# Patient Record
Sex: Female | Born: 1945 | Race: Black or African American | Hispanic: No | Marital: Single | Smoking: Current every day smoker
Health system: Southern US, Community
[De-identification: ages and names within clinical notes are randomized; demographics above are authoritative.]

## PROBLEM LIST (undated history)

## (undated) DIAGNOSIS — A6000 Herpesviral infection of urogenital system, unspecified: Secondary | ICD-10-CM

## (undated) DIAGNOSIS — J449 Chronic obstructive pulmonary disease, unspecified: Secondary | ICD-10-CM

## (undated) DIAGNOSIS — M1990 Unspecified osteoarthritis, unspecified site: Secondary | ICD-10-CM

## (undated) DIAGNOSIS — M069 Rheumatoid arthritis, unspecified: Secondary | ICD-10-CM

## (undated) DIAGNOSIS — J45909 Unspecified asthma, uncomplicated: Secondary | ICD-10-CM

## (undated) HISTORY — PX: TUBAL LIGATION: SHX77

## (undated) HISTORY — PX: TONSILLECTOMY: SUR1361

---

## 2013-02-13 ENCOUNTER — Emergency Department (HOSPITAL_COMMUNITY)
Admission: EM | Admit: 2013-02-13 | Discharge: 2013-02-13 | Disposition: A | Discharge status: Home / Self Care | Payer: Medicare Other | Attending: Emergency Medicine | Admitting: Emergency Medicine

## 2013-02-13 ENCOUNTER — Encounter (HOSPITAL_COMMUNITY): Payer: Self-pay | Admitting: Emergency Medicine

## 2013-02-13 DIAGNOSIS — M1990 Unspecified osteoarthritis, unspecified site: Secondary | ICD-10-CM | POA: Insufficient documentation

## 2013-02-13 DIAGNOSIS — J4489 Other specified chronic obstructive pulmonary disease: Secondary | ICD-10-CM | POA: Insufficient documentation

## 2013-02-13 DIAGNOSIS — M25569 Pain in unspecified knee: Secondary | ICD-10-CM | POA: Insufficient documentation

## 2013-02-13 DIAGNOSIS — J449 Chronic obstructive pulmonary disease, unspecified: Secondary | ICD-10-CM | POA: Insufficient documentation

## 2013-02-13 DIAGNOSIS — F17200 Nicotine dependence, unspecified, uncomplicated: Secondary | ICD-10-CM | POA: Insufficient documentation

## 2013-02-13 DIAGNOSIS — Z760 Encounter for issue of repeat prescription: Secondary | ICD-10-CM

## 2013-02-13 DIAGNOSIS — A6000 Herpesviral infection of urogenital system, unspecified: Secondary | ICD-10-CM | POA: Insufficient documentation

## 2013-02-13 DIAGNOSIS — M25561 Pain in right knee: Secondary | ICD-10-CM

## 2013-02-13 HISTORY — DX: Rheumatoid arthritis, unspecified: M06.9

## 2013-02-13 HISTORY — DX: Unspecified osteoarthritis, unspecified site: M19.90

## 2013-02-13 HISTORY — DX: Chronic obstructive pulmonary disease, unspecified: J44.9

## 2013-02-13 HISTORY — DX: Herpesviral infection of urogenital system, unspecified: A60.00

## 2013-02-13 MED ORDER — PREDNISONE 20 MG PO TABS: ORAL_TABLET | ORAL | Status: DC

## 2013-02-13 MED ORDER — VALACYCLOVIR HCL 1 G PO TABS: 1000.0000 mg | ORAL_TABLET | Freq: Two times a day (BID) | ORAL | Status: AC

## 2013-02-13 MED ORDER — PREDNISONE 20 MG PO TABS
60.0000 mg | ORAL_TABLET | Freq: Once | ORAL | Status: AC
  Administered 2013-02-13: 60 mg via ORAL
  Filled 2013-02-13: qty 3

## 2013-02-13 NOTE — ED Provider Notes (Signed)
History     CSN: [id number]  Arrival date & time 02/13/13  1478   First MD Initiated Contact with Patient 02/13/13 1015      Chief Complaint  Patient presents with  . Rheumatoid Arthritis  . Osteoarthritis  . genital herpes     (Consider location/radiation/quality/duration/timing/severity/associated sxs/prior treatment) HPI Comments: Patient who moved to area several days ago, presents with request for refill of Valtrex for flare of genital herpes as well as requesting prednisone for flare of rheumatoid arthritis. Patient has swelling and pain in her right knee which she states is consistent with reduced rheumatoid flares. Patient has pain medications at home that she uses as needed. She denies fever, chills, nausea, vomiting, abdominal pain, urinary symptoms. She also requests primary care physician referrals. Onset of symptoms gradual. Course is constant. Nothing makes symptoms better or worse.  The history is provided by the patient.    Past Medical History  Diagnosis Date  . Osteoarthritis   . Rheumatoid arthritis   . Genital herpes   . COPD (chronic obstructive pulmonary disease)     Past Surgical History  Procedure Laterality Date  . Tubal ligation    . Tonsillectomy      No family history on file.  History  Substance Use Topics  . Smoking status: Current Every Day Smoker -- 0.50 packs/day    Types: Cigarettes  . Smokeless tobacco: Not on file  . Alcohol Use: Yes    OB History   Grav Para Term Preterm Abortions TAB SAB Ect Mult Living                  Review of Systems  Constitutional: Negative for fever.  HENT: Negative for sore throat and rhinorrhea.   Eyes: Negative for redness.  Respiratory: Negative for cough.   Cardiovascular: Negative for chest pain.  Gastrointestinal: Negative for nausea, vomiting, abdominal pain and diarrhea.  Genitourinary: Positive for genital sores. Negative for dysuria.  Musculoskeletal: Positive for joint swelling and  arthralgias. Negative for myalgias.  Skin: Negative for rash.  Neurological: Negative for headaches.    Allergies  Penicillins  Home Medications   Current Outpatient Rx  Name  Route  Sig  Dispense  Refill  . valACYclovir (VALTREX) 1000 MG tablet   Oral   Take 1 tablet (1,000 mg total) by mouth 2 (two) times daily.   20 tablet   0     BP 146/98  Pulse 96  Temp(Src) 97.5 F (36.4 C) (Oral)  Resp 20  SpO2 99%  Physical Exam  Nursing note and vitals reviewed. Constitutional: She appears well-developed and well-nourished.  HENT:  Head: Normocephalic and atraumatic.  Eyes: Conjunctivae are normal.  Neck: Normal range of motion. Neck supple.  Pulmonary/Chest: No respiratory distress.  Genitourinary:  Patient defers GU exam.   Musculoskeletal:       Right hip: Normal.       Right knee: She exhibits swelling (mild). She exhibits normal range of motion and no effusion. Tenderness found. Lateral joint line tenderness noted. No medial joint line tenderness noted.       Right ankle: Normal.  Neurological: She is alert.  Skin: Skin is warm and dry.  Psychiatric: She has a normal mood and affect.    ED Course  Procedures (including critical care time)  Labs Reviewed - No data to display No results found.   1. Knee pain, right   2. Medication refill     10:25 AM Patient seen and examined.  Work-up initiated. Medications ordered.   Vital signs reviewed and are as follows: Filed Vitals:   02/13/13 1006  BP: 146/98  Pulse: 96  Temp: 97.5 F (36.4 C)  Resp: 20   D/c with course of valtrex and prednisone taper.   I asked nurse to have social worker see patient to provide additional PCP referrals.   Patient urged to return with worsening symptoms or other concerns. Patient verbalized understanding and agrees with plan.   Patient verbalizes understanding and agrees with plan.     MDM  Pt here for med refills. Genital herpes flare for first time in 3 years --  given valtrex. Pt does not report h/o kidney problems.   Also, prednisone given for joint pain. No suspicion of septic arthritis.         Renne Crigler, PA-C 02/13/13 1051

## 2013-02-13 NOTE — ED Provider Notes (Signed)
Medical screening examination/treatment/procedure(s) were performed by non-physician practitioner and as supervising physician I was immediately available for consultation/collaboration.  Ethelda Chick, MD 02/13/13 1051

## 2013-02-13 NOTE — ED Notes (Signed)
Patient states she just moved here 5 x days ago.  Patient claims she needs medication refills for genital herpes, rheumatoid arthritis, and osteoarthritis.

## 2013-03-03 ENCOUNTER — Encounter (HOSPITAL_COMMUNITY): Payer: Self-pay | Admitting: Adult Health

## 2013-03-03 ENCOUNTER — Emergency Department (HOSPITAL_COMMUNITY)
Admission: EM | Admit: 2013-03-03 | Discharge: 2013-03-03 | Disposition: A | Discharge status: Home / Self Care | Payer: Medicare Other | Attending: Emergency Medicine | Admitting: Emergency Medicine

## 2013-03-03 ENCOUNTER — Ambulatory Visit: Payer: Self-pay

## 2013-03-03 DIAGNOSIS — IMO0001 Reserved for inherently not codable concepts without codable children: Secondary | ICD-10-CM | POA: Insufficient documentation

## 2013-03-03 DIAGNOSIS — S90569A Insect bite (nonvenomous), unspecified ankle, initial encounter: Secondary | ICD-10-CM | POA: Insufficient documentation

## 2013-03-03 DIAGNOSIS — J449 Chronic obstructive pulmonary disease, unspecified: Secondary | ICD-10-CM | POA: Insufficient documentation

## 2013-03-03 DIAGNOSIS — Y929 Unspecified place or not applicable: Secondary | ICD-10-CM | POA: Insufficient documentation

## 2013-03-03 DIAGNOSIS — S40269A Insect bite (nonvenomous) of unspecified shoulder, initial encounter: Secondary | ICD-10-CM | POA: Insufficient documentation

## 2013-03-03 DIAGNOSIS — R21 Rash and other nonspecific skin eruption: Secondary | ICD-10-CM | POA: Insufficient documentation

## 2013-03-03 DIAGNOSIS — J4489 Other specified chronic obstructive pulmonary disease: Secondary | ICD-10-CM | POA: Insufficient documentation

## 2013-03-03 DIAGNOSIS — F17200 Nicotine dependence, unspecified, uncomplicated: Secondary | ICD-10-CM | POA: Insufficient documentation

## 2013-03-03 DIAGNOSIS — Z880 Allergy status to penicillin: Secondary | ICD-10-CM | POA: Insufficient documentation

## 2013-03-03 DIAGNOSIS — W57XXXA Bitten or stung by nonvenomous insect and other nonvenomous arthropods, initial encounter: Secondary | ICD-10-CM | POA: Insufficient documentation

## 2013-03-03 DIAGNOSIS — IMO0002 Reserved for concepts with insufficient information to code with codable children: Secondary | ICD-10-CM | POA: Insufficient documentation

## 2013-03-03 DIAGNOSIS — Z8739 Personal history of other diseases of the musculoskeletal system and connective tissue: Secondary | ICD-10-CM | POA: Insufficient documentation

## 2013-03-03 DIAGNOSIS — Z79899 Other long term (current) drug therapy: Secondary | ICD-10-CM | POA: Insufficient documentation

## 2013-03-03 DIAGNOSIS — Y9389 Activity, other specified: Secondary | ICD-10-CM | POA: Insufficient documentation

## 2013-03-03 MED ORDER — HYDROXYZINE HCL 25 MG PO TABS: 25.0000 mg | ORAL_TABLET | Freq: Four times a day (QID) | ORAL | Status: DC | PRN

## 2013-03-03 MED ORDER — PREDNISONE 20 MG PO TABS: 60.0000 mg | ORAL_TABLET | Freq: Once | ORAL | Status: AC

## 2013-03-03 MED ORDER — PREDNISONE 20 MG PO TABS
60.0000 mg | ORAL_TABLET | Freq: Once | ORAL | Status: AC
  Administered 2013-03-03: 60 mg via ORAL
  Filled 2013-03-03: qty 3

## 2013-03-03 MED ORDER — HYDROXYZINE HCL 25 MG PO TABS
25.0000 mg | ORAL_TABLET | Freq: Once | ORAL | Status: AC
  Administered 2013-03-03: 25 mg via ORAL
  Filled 2013-03-03: qty 1

## 2013-03-03 MED ORDER — HYDROXYZINE HCL 50 MG/ML IM SOLN: 25.0000 mg | Freq: Four times a day (QID) | INTRAMUSCULAR | Status: DC | PRN

## 2013-03-03 MED ORDER — FAMOTIDINE 20 MG PO TABS
20.0000 mg | ORAL_TABLET | Freq: Once | ORAL | Status: AC
  Administered 2013-03-03: 20 mg via ORAL
  Filled 2013-03-03: qty 1

## 2013-03-03 NOTE — ED Notes (Signed)
Patient C/o itching. States that she is allergic to insect bites and she was bitten. States that it began yesterday and she began taking benadryl.  States that it did not help.  Hives are noted on her arms, legs and trunk.

## 2013-03-03 NOTE — ED Provider Notes (Signed)
History    This chart was scribed for non-physician practitioner, Arnoldo Hooker PA-C working with Glynn Octave, MD by Donne Anon, ED Scribe. This patient was seen in room TR07C/TR07C and the patient's care was started at 2106.   CSN: [id number]  Arrival date & time 03/03/13  1850   First MD Initiated Contact with Patient 03/03/13 2106      Chief Complaint  Patient presents with  . Rash     The history is provided by the patient. No language interpreter was used.   HPI Comments: Julie Shepherd is a 67 y.o. female who presents to the Emergency Department complaining of gradual onset, gradually worsening, constant rash to her arms, chest, thighs, and feet and began this morning. She does not recall being bitten by any insects. She has tried Benadryl and Motrin with no relief. She reports no one she lives with has a similar rash. She reports she has an episode similar to this several years ago. She denies difficulty breathing, lip swelling, tongue swelling or any other pain.  Past Medical History  Diagnosis Date  . Osteoarthritis   . Rheumatoid arthritis(714.0)   . Genital herpes   . COPD (chronic obstructive pulmonary disease)     Past Surgical History  Procedure Laterality Date  . Tubal ligation    . Tonsillectomy      History reviewed. No pertinent family history.  History  Substance Use Topics  . Smoking status: Current Every Day Smoker -- 0.50 packs/day    Types: Cigarettes  . Smokeless tobacco: Not on file  . Alcohol Use: Yes     Review of Systems  Constitutional: Negative for fever.  HENT: Negative for facial swelling.   Respiratory: Negative for shortness of breath.   Gastrointestinal: Negative for nausea.  Skin: Positive for rash.    Allergies  Penicillins  Home Medications   Current Outpatient Rx  Name  Route  Sig  Dispense  Refill  . albuterol (PROVENTIL HFA;VENTOLIN HFA) 108 (90 BASE) MCG/ACT inhaler   Inhalation   Inhale 1 puff into the  lungs every 6 (six) hours as needed for wheezing or shortness of breath (COPD).          Marland Kitchen alendronate (FOSAMAX) 70 MG tablet   Oral   Take 70 mg by mouth every 7 (seven) days. Take with a full glass of water on an empty stomach.  Usually takes on Saturday.         . cyclobenzaprine (FLEXERIL) 10 MG tablet   Oral   Take 10 mg by mouth daily as needed for muscle spasms.         . Fluticasone-Salmeterol (ADVAIR) 500-50 MCG/DOSE AEPB   Inhalation   Inhale 1 puff into the lungs daily as needed (COPD).         Marland Kitchen HYDROcodone-acetaminophen (NORCO) 7.5-325 MG per tablet   Oral   Take 1 tablet by mouth 3 (three) times daily as needed for pain.         Marland Kitchen ibuprofen (ADVIL,MOTRIN) 800 MG tablet   Oral   Take 800 mg by mouth daily as needed for pain (Pain and swelling in knee).         Marland Kitchen leucovorin (WELLCOVORIN) 5 MG tablet   Oral   Take 10 mg by mouth once a week. Take two tablets once a week after taking methotrexate.         Marland Kitchen lisinopril (PRINIVIL,ZESTRIL) 10 MG tablet   Oral   Take 10 mg  by mouth daily.         . methotrexate (RHEUMATREX) 2.5 MG tablet   Oral   Take 20 mg by mouth once a week. Caution:Chemotherapy. Protect from light. Takes 8 tablets every week on Thursdays.         Marland Kitchen omeprazole (PRILOSEC) 20 MG capsule   Oral   Take 20 mg by mouth daily as needed (Acid reflux).         . Oxycodone-Acetaminophen (PERCOCET PO)   Oral   Take 1 tablet by mouth 2 (two) times daily as needed (Pain).         . predniSONE (DELTASONE) 20 MG tablet      3 Tabs PO Days 1-3, then 2 tabs PO Days 4-6, then 1 tab PO Day 7-9, then Half Tab PO Day 10-12   20 tablet   0   . traMADol (ULTRAM) 50 MG tablet   Oral   Take 50-100 mg by mouth daily as needed for pain (Knee arthritic pain).          . Vitamin D, Ergocalciferol, (DRISDOL) 50000 UNITS CAPS   Oral   Take 50,000 Units by mouth once a week.         . zolpidem (AMBIEN) 10 MG tablet   Oral   Take 5-10 mg  by mouth at bedtime as needed for sleep.            BP 137/91  Pulse 95  Temp(Src) 98.3 F (36.8 C) (Oral)  Resp 16  Wt 139 lb 8 oz (63.277 kg)  SpO2 100%  Physical Exam  Nursing note and vitals reviewed. Constitutional: She is oriented to person, place, and time. She appears well-developed and well-nourished. No distress.  HENT:  Head: Normocephalic and atraumatic.  Eyes: EOM are normal.  Neck: Neck supple. No tracheal deviation present.  Cardiovascular: Normal rate.   Pulmonary/Chest: Effort normal. No respiratory distress.  Musculoskeletal: Normal range of motion.  Neurological: She is alert and oriented to person, place, and time.  Skin: Skin is warm and dry.  Singular raised lesions with central induration to left lower extremity and right upper extremity and shoulder consistent with insect bites. Inconsistent with hives.  Psychiatric: She has a normal mood and affect. Her behavior is normal.    ED Course  Procedures (including critical care time) DIAGNOSTIC STUDIES: Oxygen Saturation is 100% on room air, normal by my interpretation.    COORDINATION OF CARE: 9:12 PM Discussed treatment plan which includes Prednisone and an antiinflamitory with pt at bedside and pt agreed to plan. Advised pt to change locag    Labs Reviewed - No data to display No results found.   No diagnosis found.  1. Bug bites  MDM  Findings not consistent with hives, rather multiple bites of an insect - possible bedbugs as she is staying in a new place temporarily while in [place].    I personally performed the services described in this documentation, which was scribed in my presence. The recorded information has been reviewed and is accurate.       Arnoldo Hooker, PA-C 03/03/13 2138

## 2013-03-03 NOTE — ED Notes (Signed)
Presents with rash to bilateral extemities associated with itchiness. Pt reports it began last night and has tried benadryl with no relief.

## 2013-03-04 NOTE — ED Provider Notes (Signed)
Medical screening examination/treatment/procedure(s) were performed by non-physician practitioner and as supervising physician I was immediately available for consultation/collaboration.   Sie Formisano, MD 03/04/13 0015 

## 2013-08-07 ENCOUNTER — Emergency Department (HOSPITAL_COMMUNITY)
Admission: EM | Admit: 2013-08-07 | Discharge: 2013-08-07 | Disposition: A | Discharge status: Home / Self Care | Payer: Medicare Other | Attending: Emergency Medicine | Admitting: Emergency Medicine

## 2013-08-07 ENCOUNTER — Encounter (HOSPITAL_COMMUNITY): Payer: Self-pay | Admitting: Emergency Medicine

## 2013-08-07 DIAGNOSIS — M25569 Pain in unspecified knee: Secondary | ICD-10-CM | POA: Insufficient documentation

## 2013-08-07 DIAGNOSIS — M069 Rheumatoid arthritis, unspecified: Secondary | ICD-10-CM | POA: Insufficient documentation

## 2013-08-07 DIAGNOSIS — J4489 Other specified chronic obstructive pulmonary disease: Secondary | ICD-10-CM | POA: Insufficient documentation

## 2013-08-07 DIAGNOSIS — Z880 Allergy status to penicillin: Secondary | ICD-10-CM | POA: Insufficient documentation

## 2013-08-07 DIAGNOSIS — Z8619 Personal history of other infectious and parasitic diseases: Secondary | ICD-10-CM | POA: Insufficient documentation

## 2013-08-07 DIAGNOSIS — Z760 Encounter for issue of repeat prescription: Secondary | ICD-10-CM | POA: Insufficient documentation

## 2013-08-07 DIAGNOSIS — J449 Chronic obstructive pulmonary disease, unspecified: Secondary | ICD-10-CM | POA: Insufficient documentation

## 2013-08-07 DIAGNOSIS — G8929 Other chronic pain: Secondary | ICD-10-CM | POA: Insufficient documentation

## 2013-08-07 DIAGNOSIS — M1990 Unspecified osteoarthritis, unspecified site: Secondary | ICD-10-CM | POA: Insufficient documentation

## 2013-08-07 DIAGNOSIS — F17200 Nicotine dependence, unspecified, uncomplicated: Secondary | ICD-10-CM | POA: Insufficient documentation

## 2013-08-07 DIAGNOSIS — Z79899 Other long term (current) drug therapy: Secondary | ICD-10-CM | POA: Insufficient documentation

## 2013-08-07 MED ORDER — PREDNISONE 20 MG PO TABS: ORAL_TABLET | ORAL | Status: DC

## 2013-08-07 MED ORDER — HYDROCODONE-ACETAMINOPHEN 5-325 MG PO TABS
1.0000 | ORAL_TABLET | Freq: Once | ORAL | Status: AC
  Administered 2013-08-07: 1 via ORAL
  Filled 2013-08-07: qty 1

## 2013-08-07 NOTE — ED Provider Notes (Signed)
Medical screening examination/treatment/procedure(s) were performed by non-physician practitioner and as supervising physician I was immediately available for consultation/collaboration.  EKG Interpretation   None        Jantz Main, MD 08/07/13 1634 

## 2013-08-07 NOTE — ED Provider Notes (Signed)
CSN: [id number]     Arrival date & time 08/07/13  1016 History   First MD Initiated Contact with Patient 08/07/13 1019     Chief Complaint  Patient presents with  . Medication Refill   (Consider location/radiation/quality/duration/timing/severity/associated sxs/prior Treatment) HPI  67 year old female with history of rheumatoid arthritis presents to the ER requesting for medication refill. Patient states she has been dealing with rheumatoid arthritis for the past 12 years. She is here today requesting for refill of her hydrocodone that she has recently ran out several days ago. States she has ibuprofen and tramadol at home for pain. Her rheumatoid arthritis seems to affect her right knee the most pain has been flaring up for the past several weeks. Her doctor is in [facility] She is planning to move to [facility] permanently and also wanted referral resources for PCP.  She planned on having her right knee operated with knee replacement after the holiday and also request for an orthopedic referral. Otherwise patient has no other complaints. Specifically no fever, rash, numbness or weakness. Denies any history of kidney disease. Request for refill of her prednisone as it usually helps her RA.    Past Medical History  Diagnosis Date  . Osteoarthritis   . Rheumatoid arthritis(714.0)   . Genital herpes   . COPD (chronic obstructive pulmonary disease)    Past Surgical History  Procedure Laterality Date  . Tubal ligation    . Tonsillectomy     History reviewed. No pertinent family history. History  Substance Use Topics  . Smoking status: Current Every Day Smoker -- 0.50 packs/day    Types: Cigarettes  . Smokeless tobacco: Not on file  . Alcohol Use: Yes   OB History   Grav Para Term Preterm Abortions TAB SAB Ect Mult Living                 Review of Systems  Constitutional: Negative for fever.  Skin: Negative for rash.  Neurological: Negative for numbness.  All other  systems reviewed and are negative.    Allergies  Penicillins  Home Medications   Current Outpatient Rx  Name  Route  Sig  Dispense  Refill  . albuterol (PROVENTIL HFA;VENTOLIN HFA) 108 (90 BASE) MCG/ACT inhaler   Inhalation   Inhale 1 puff into the lungs every 6 (six) hours as needed for wheezing or shortness of breath (COPD).          Marland Kitchen alendronate (FOSAMAX) 70 MG tablet   Oral   Take 70 mg by mouth every 7 (seven) days. Take with a full glass of water on an empty stomach.  Usually takes on Saturday.         . hydrOXYzine (ATARAX/VISTARIL) 25 MG tablet   Oral   Take 1 tablet (25 mg total) by mouth every 6 (six) hours as needed for itching.   15 tablet   0   . methotrexate (RHEUMATREX) 2.5 MG tablet   Oral   Take 20 mg by mouth once a week. Caution:Chemotherapy. Protect from light. Takes 8 tablets every week on Thursdays.         Marland Kitchen omeprazole (PRILOSEC) 20 MG capsule   Oral   Take 20 mg by mouth daily as needed (Acid reflux).          There were no vitals taken for this visit. Physical Exam  Nursing note and vitals reviewed. Constitutional: She appears well-developed and well-nourished. No distress.  HENT:  Head: Atraumatic.  Eyes: Conjunctivae are  normal.  Neck: Neck supple.  Musculoskeletal: She exhibits tenderness (R knee: mildly tender with flexion, normal extension, no rash, no erythema, warmth or signs of infection.  ).  Neurological: She is alert.  Able to ambulate  Skin: No rash noted.  Psychiatric: She has a normal mood and affect.    ED Course  Procedures (including critical care time)  10:33 AM Pt request for refill of her narcotic pain med, which i declined.  I recommend having her PCP manage her chronic pain.  Will refill prednisone course, as well as giving pt outpt resources and ortho referral as requested.  Otherwise, no evidence of septic arthritis or other acute finding on today's exam.  Pt agrees with plan.    Labs Review Labs  Reviewed - No data to display Imaging Review No results found.  EKG Interpretation   None       MDM   1. Encounter for medication refill    BP 138/85  Pulse 95  Temp(Src) 97.9 F (36.6 C) (Oral)  Resp 16  SpO2 100%     Fayrene Helper, PA-C 08/07/13 1037

## 2013-08-07 NOTE — ED Notes (Signed)
Pt states she wants refill of pain medications for RA, denies any new problems

## 2013-09-09 ENCOUNTER — Other Ambulatory Visit: Payer: Self-pay | Admitting: Orthopaedic Surgery

## 2013-09-09 DIAGNOSIS — M25561 Pain in right knee: Secondary | ICD-10-CM

## 2013-09-17 ENCOUNTER — Other Ambulatory Visit: Payer: Medicare Other

## 2013-09-25 ENCOUNTER — Other Ambulatory Visit: Payer: Medicare Other

## 2013-10-04 ENCOUNTER — Other Ambulatory Visit: Payer: Medicare Other

## 2013-10-17 ENCOUNTER — Emergency Department (HOSPITAL_COMMUNITY)
Admission: EM | Admit: 2013-10-17 | Discharge: 2013-10-17 | Discharge status: Against Medical Advice | Payer: Medicare Other | Attending: Emergency Medicine | Admitting: Emergency Medicine

## 2013-10-17 ENCOUNTER — Emergency Department (HOSPITAL_COMMUNITY): Payer: Medicare Other

## 2013-10-17 ENCOUNTER — Encounter (HOSPITAL_COMMUNITY): Payer: Self-pay | Admitting: Emergency Medicine

## 2013-10-17 DIAGNOSIS — F17200 Nicotine dependence, unspecified, uncomplicated: Secondary | ICD-10-CM | POA: Insufficient documentation

## 2013-10-17 DIAGNOSIS — J45901 Unspecified asthma with (acute) exacerbation: Principal | ICD-10-CM

## 2013-10-17 DIAGNOSIS — J441 Chronic obstructive pulmonary disease with (acute) exacerbation: Secondary | ICD-10-CM | POA: Insufficient documentation

## 2013-10-17 HISTORY — DX: Unspecified asthma, uncomplicated: J45.909

## 2013-10-17 MED ORDER — ALBUTEROL SULFATE (2.5 MG/3ML) 0.083% IN NEBU
5.0000 mg | INHALATION_SOLUTION | Freq: Once | RESPIRATORY_TRACT | Status: AC
  Administered 2013-10-17: 5 mg via RESPIRATORY_TRACT
  Filled 2013-10-17: qty 6

## 2013-10-17 NOTE — ED Notes (Signed)
Pt. reports asthma attack onset today unrelieved by MDI , occasional productive cough , wheezing and nasal congestion . Denies fever or chills.

## 2013-10-17 NOTE — ED Notes (Signed)
Pt left- stating she had to go pick up granddaughter to [facility] because she was going into labor.

## 2013-12-28 ENCOUNTER — Encounter (HOSPITAL_COMMUNITY): Payer: Self-pay | Admitting: Emergency Medicine

## 2013-12-28 ENCOUNTER — Emergency Department (HOSPITAL_COMMUNITY): Payer: Medicare Other

## 2013-12-28 ENCOUNTER — Emergency Department (HOSPITAL_COMMUNITY)
Admission: EM | Admit: 2013-12-28 | Discharge: 2013-12-28 | Disposition: A | Discharge status: Home / Self Care | Payer: Medicare Other | Attending: Emergency Medicine | Admitting: Emergency Medicine

## 2013-12-28 DIAGNOSIS — M069 Rheumatoid arthritis, unspecified: Secondary | ICD-10-CM | POA: Insufficient documentation

## 2013-12-28 DIAGNOSIS — M25569 Pain in unspecified knee: Secondary | ICD-10-CM | POA: Insufficient documentation

## 2013-12-28 DIAGNOSIS — Z880 Allergy status to penicillin: Secondary | ICD-10-CM | POA: Insufficient documentation

## 2013-12-28 DIAGNOSIS — J449 Chronic obstructive pulmonary disease, unspecified: Secondary | ICD-10-CM | POA: Insufficient documentation

## 2013-12-28 DIAGNOSIS — Z79899 Other long term (current) drug therapy: Secondary | ICD-10-CM | POA: Insufficient documentation

## 2013-12-28 DIAGNOSIS — A6000 Herpesviral infection of urogenital system, unspecified: Secondary | ICD-10-CM | POA: Insufficient documentation

## 2013-12-28 DIAGNOSIS — J45909 Unspecified asthma, uncomplicated: Secondary | ICD-10-CM | POA: Insufficient documentation

## 2013-12-28 DIAGNOSIS — J4489 Other specified chronic obstructive pulmonary disease: Secondary | ICD-10-CM | POA: Insufficient documentation

## 2013-12-28 DIAGNOSIS — M25561 Pain in right knee: Secondary | ICD-10-CM

## 2013-12-28 DIAGNOSIS — F17200 Nicotine dependence, unspecified, uncomplicated: Secondary | ICD-10-CM | POA: Insufficient documentation

## 2013-12-28 DIAGNOSIS — M1990 Unspecified osteoarthritis, unspecified site: Secondary | ICD-10-CM | POA: Insufficient documentation

## 2013-12-28 DIAGNOSIS — M25469 Effusion, unspecified knee: Secondary | ICD-10-CM | POA: Insufficient documentation

## 2013-12-28 MED ORDER — KETOROLAC TROMETHAMINE 60 MG/2ML IM SOLN
30.0000 mg | Freq: Once | INTRAMUSCULAR | Status: AC
  Administered 2013-12-28: 30 mg via INTRAMUSCULAR
  Filled 2013-12-28: qty 2

## 2013-12-28 MED ORDER — OXYCODONE-ACETAMINOPHEN 5-325 MG PO TABS: 1.0000 | ORAL_TABLET | Freq: Three times a day (TID) | ORAL | Status: AC | PRN

## 2013-12-28 NOTE — ED Provider Notes (Signed)
CSN: [id number]     Arrival date & time 12/28/13  1614 History  This chart was scribed for non-physician practitioner Felicie Morn, NP working with Glynn Octave, MD by Valera Castle, ED scribe. This patient was seen in room TR06C/TR06C and the patient's care was started at 6:10 PM.   Chief Complaint  Patient presents with  . Knee Pain   (Consider location/radiation/quality/duration/timing/severity/associated sxs/prior Treatment) Patient is a 68 y.o. female presenting with knee pain. The history is provided by the patient. No language interpreter was used.  Knee Pain Location:  Knee Knee location:  R knee Pain details:    Quality:  Sharp   Progression:  Worsening Chronicity:  Recurrent (rheumatoid arthritis) Relieved by:  Nothing Ineffective treatments:  Muscle relaxant, NSAIDs and acetaminophen  HPI Comments: Julie Shepherd is a 68 y.o. female with h/o right knee pain and arthritis, who presents to the Emergency Department complaining of sharp, stabbing pain in her medial right knee. She states her knee gave out on her yesterday, and does so from time to time. She states she has a knee brace at home, but has not been wearing it due to pain. She reports trouble sleeping due to her knee pain despite taking Ambien. She reports taking Tramadol, Ibuprofen, Meloxicam, and Flexeril without relief. She states she has gone to pain management, but has not heard back from them. She states she has been told she needs to have knee surgery, was resistant in the past, but is open to the idea now due to the increased pain. She states she has a rheumatology appointment on 04/13. She denies any other symptoms. She reports h/o smoking.   PCP - No PCP Per Patient  Past Medical History  Diagnosis Date  . Osteoarthritis   . Rheumatoid arthritis(714.0)   . Genital herpes   . COPD (chronic obstructive pulmonary disease)   . Asthma    Past Surgical History  Procedure Laterality Date  . Tubal ligation     . Tonsillectomy     No family history on file. History  Substance Use Topics  . Smoking status: Current Every Day Smoker -- 0.50 packs/day    Types: Cigarettes  . Smokeless tobacco: Not on file  . Alcohol Use: Yes   OB History   Grav Para Term Preterm Abortions TAB SAB Ect Mult Living                 Review of Systems  Musculoskeletal: Positive for arthralgias (right knee) and joint swelling. Negative for myalgias.  All other systems reviewed and are negative.   Allergies  Penicillins  Home Medications   Current Outpatient Rx  Name  Route  Sig  Dispense  Refill  . alendronate (FOSAMAX) 70 MG tablet   Oral   Take 70 mg by mouth every 7 (seven) days. Take with a full glass of water on an empty stomach.  Usually takes on Saturday.         . cyclobenzaprine (FLEXERIL) 10 MG tablet   Oral   Take 10 mg by mouth 3 (three) times daily as needed for muscle spasms.         . Fluticasone-Salmeterol (ADVAIR) 500-50 MCG/DOSE AEPB   Inhalation   Inhale 1 puff into the lungs daily as needed (for shortness of breath).          Marland Kitchen ibuprofen (ADVIL,MOTRIN) 800 MG tablet   Oral   Take 800 mg by mouth every 8 (eight) hours as needed for pain.         Marland Kitchen  leucovorin (WELLCOVORIN) 10 MG tablet   Oral   Take 20 mg by mouth once a week. Takes 2 tabs on the day after methotrexate (tuesday)         . methotrexate (RHEUMATREX) 2.5 MG tablet   Oral   Take 20 mg by mouth once a week. Caution:Chemotherapy. Protect from light. Takes 8 tablets every week on Tuesdays.         . traMADol (ULTRAM) 50 MG tablet   Oral   Take 50 mg by mouth every 12 (twelve) hours as needed for moderate pain.         Marland Kitchen zolpidem (AMBIEN) 10 MG tablet   Oral   Take 10 mg by mouth at bedtime as needed for sleep.          BP 129/87  Pulse 115  Temp(Src) 98 F (36.7 C) (Oral)  Resp 18  SpO2 96%  Physical Exam  Nursing note and vitals reviewed. Constitutional: She is oriented to person,  place, and time. She appears well-developed and well-nourished. No distress.  HENT:  Head: Normocephalic and atraumatic.  Eyes: EOM are normal.  Neck: Neck supple. No tracheal deviation present.  Cardiovascular: Normal rate and intact distal pulses.   Pulmonary/Chest: Effort normal. No respiratory distress.  Musculoskeletal: Normal range of motion. She exhibits edema and tenderness.  Right knee, mild tenderness and swelling. Joint is stable. DP intact.   Neurological: She is alert and oriented to person, place, and time.  Skin: Skin is warm and dry.  Psychiatric: She has a normal mood and affect. Her behavior is normal.   ED Course  Procedures (including critical care time)  DIAGNOSTIC STUDIES: Oxygen Saturation is 96% on room air, normal by my interpretation.    COORDINATION OF CARE: 6:23 PM-Discussed treatment plan which includes stronger pain medicaion with pt at bedside and pt agreed to plan.   No results found for this or any previous visit. Dg Shoulder Right  12/28/2013   CLINICAL DATA:  Fall, right shoulder and knee pain  EXAM: RIGHT SHOULDER - 2+ VIEW  COMPARISON:  Prior chest x-ray 10/17/2013  FINDINGS: There is no evidence of fracture or dislocation. There is no evidence of arthropathy or other focal bone abnormality. Soft tissues are unremarkable.  IMPRESSION: Negative.   Electronically Signed   By: Malachy Moan M.D.   On: 12/28/2013 17:54   Dg Knee Complete 4 Views Right  12/28/2013   CLINICAL DATA:  Fall, right shoulder in the pain  EXAM: RIGHT KNEE - COMPLETE 4+ VIEW  COMPARISON:  None.  FINDINGS: No acute fracture or malalignment. Moderately large suprapatellar knee joint effusion. Mild tricompartmental degenerative osteoarthritis most significant in the medial compartment where there is asymmetric joint space narrowing and productive osteophyte formation. Mild scattered atherosclerotic vascular calcifications noted incidentally. No lytic or blastic osseous lesion.   IMPRESSION: 1. Moderately large suprapatellar knee joint effusion. Differential considerations include acute internal derangement with hemarthrosis, degenerative, and less likely infectious. 2. No acute fracture or malalignment. 3. Tricompartmental degenerative osteoarthritis most significant in the medial compartment.   Electronically Signed   By: Malachy Moan M.D.   On: 12/28/2013 17:56    EKG Interpretation None     Medications - No data to display  Radiology results reviewed and shared with patient. MDM   Final diagnoses:  None    Chronic right knee pain.  History of recurrent joint effusion.  No fever, is able to bend and extend knee, doubt septic joint. Follow-up with  her orthopedist.  I personally performed the services described in this documentation, which was scribed in my presence. The recorded information has been reviewed and is accurate.    Jimmye Norman, NP 12/29/13 [phone]

## 2013-12-28 NOTE — ED Notes (Signed)
30 min wait for D/C because of IM Pain medication.

## 2013-12-28 NOTE — ED Notes (Addendum)
Pt c/o right knee and right shoulder pain. sts hx of arthritis. sts she fell today while she was trying to get out of the pain, her knee gave out causing her to fall. sts her knee gives out often, especially when she is in a rush and was rushing this morning. Reports she has been trying to get into a doctor and pain management center but hasn't been able to get an appointment yet. Pt sts she would like an xray and pain medicine. Hx of fracture to right knee. Denies bruising and swelling to knee and shoulder sts she does use a cane sometimes and is supposed to wear a knee brace but doesn't wear it because it isn't comfortable. Nad, skin warm and dry, resp e/u.

## 2013-12-28 NOTE — ED Notes (Signed)
Patient transported to X-ray 

## 2013-12-28 NOTE — Discharge Instructions (Signed)

## 2013-12-29 NOTE — ED Provider Notes (Signed)
Medical screening examination/treatment/procedure(s) were performed by non-physician practitioner and as supervising physician I was immediately available for consultation/collaboration.   EKG Interpretation None        Glynn Octave, MD 12/29/13 1016

## 2014-01-08 ENCOUNTER — Encounter (HOSPITAL_COMMUNITY): Payer: Self-pay | Admitting: Emergency Medicine

## 2014-01-08 ENCOUNTER — Emergency Department (INDEPENDENT_AMBULATORY_CARE_PROVIDER_SITE_OTHER)
Admission: EM | Admit: 2014-01-08 | Discharge: 2014-01-08 | Disposition: A | Discharge status: Home / Self Care | Payer: Medicare Other | Source: Home / Self Care

## 2014-01-08 DIAGNOSIS — M25561 Pain in right knee: Secondary | ICD-10-CM

## 2014-01-08 DIAGNOSIS — M25569 Pain in unspecified knee: Secondary | ICD-10-CM

## 2014-01-08 DIAGNOSIS — M25469 Effusion, unspecified knee: Secondary | ICD-10-CM | POA: Diagnosis not present

## 2014-01-08 DIAGNOSIS — M1710 Unilateral primary osteoarthritis, unspecified knee: Secondary | ICD-10-CM | POA: Diagnosis not present

## 2014-01-08 DIAGNOSIS — IMO0002 Reserved for concepts with insufficient information to code with codable children: Secondary | ICD-10-CM | POA: Diagnosis not present

## 2014-01-08 DIAGNOSIS — M179 Osteoarthritis of knee, unspecified: Secondary | ICD-10-CM

## 2014-01-08 DIAGNOSIS — M25461 Effusion, right knee: Secondary | ICD-10-CM

## 2014-01-08 NOTE — ED Provider Notes (Signed)
CSN: [id number]     Arrival date & time 01/08/14  0912 History   First MD Initiated Contact with Patient 01/08/14 [phone]     Chief Complaint  Patient presents with  . Knee Pain   (Consider location/radiation/quality/duration/timing/severity/associated sxs/prior Treatment) HPI Comments: 68 year old female presenting with a complaint of acute on chronic right knee pain. She has a history of osteoarthritis, DJD of the right knee. She was seen in emergency department approximately 10 days ago for knee pain and was given medication. She presents today stating that she notices a lot of swelling in any request the fluid be removed from the knee. She is not requesting pain medicines as she has plenty. She is scheduled to see an orthopedist between one and 2 weeks from now where she plans to discuss surgical options.   Past Medical History  Diagnosis Date  . Osteoarthritis   . Rheumatoid arthritis(714.0)   . Genital herpes   . COPD (chronic obstructive pulmonary disease)   . Asthma    Past Surgical History  Procedure Laterality Date  . Tubal ligation    . Tonsillectomy     History reviewed. No pertinent family history. History  Substance Use Topics  . Smoking status: Current Every Day Smoker -- 0.50 packs/day    Types: Cigarettes  . Smokeless tobacco: Not on file  . Alcohol Use: Yes   OB History   Grav Para Term Preterm Abortions TAB SAB Ect Mult Living                 Review of Systems  Constitutional: Negative for fever, chills and activity change.  HENT: Negative.   Respiratory: Negative.   Cardiovascular: Negative.   Musculoskeletal: Positive for arthralgias and joint swelling.       As per HPI  Skin: Negative for color change, pallor and rash.  Neurological: Negative.     Allergies  Penicillins  Home Medications   Current Outpatient Rx  Name  Route  Sig  Dispense  Refill  . alendronate (FOSAMAX) 70 MG tablet   Oral   Take 70 mg by mouth every 7 (seven) days. Take  with a full glass of water on an empty stomach.  Usually takes on Saturday.         . cyclobenzaprine (FLEXERIL) 10 MG tablet   Oral   Take 10 mg by mouth 3 (three) times daily as needed for muscle spasms.         . Fluticasone-Salmeterol (ADVAIR) 500-50 MCG/DOSE AEPB   Inhalation   Inhale 1 puff into the lungs daily as needed (for shortness of breath).          Marland Kitchen ibuprofen (ADVIL,MOTRIN) 800 MG tablet   Oral   Take 800 mg by mouth every 8 (eight) hours as needed for pain.         Marland Kitchen leucovorin (WELLCOVORIN) 10 MG tablet   Oral   Take 20 mg by mouth once a week. Takes 2 tabs on the day after methotrexate (tuesday)         . methotrexate (RHEUMATREX) 2.5 MG tablet   Oral   Take 20 mg by mouth once a week. Caution:Chemotherapy. Protect from light. Takes 8 tablets every week on Tuesdays.         Marland Kitchen oxyCODONE-acetaminophen (PERCOCET/ROXICET) 5-325 MG per tablet   Oral   Take 1 tablet by mouth every 8 (eight) hours as needed for severe pain.   15 tablet   0   . traMADol (ULTRAM)  50 MG tablet   Oral   Take 50 mg by mouth every 12 (twelve) hours as needed for moderate pain.         Marland Kitchen zolpidem (AMBIEN) 10 MG tablet   Oral   Take 10 mg by mouth at bedtime as needed for sleep.          BP 140/92  Pulse 90  Temp(Src) 98.2 F (36.8 C) (Oral)  Resp 18  SpO2 99% Physical Exam  Nursing note and vitals reviewed. Constitutional: She is oriented to person, place, and time. She appears well-developed and well-nourished. No distress.  HENT:  Head: Normocephalic and atraumatic.  Eyes: EOM are normal.  Neck: Normal range of motion. Neck supple.  Pulmonary/Chest: Effort normal. No respiratory distress.  Musculoskeletal: She exhibits edema and tenderness.  Right knee with swelling in the medial and lateral joint spaces as well as the distal aspect of the thigh and knee junction. Limited flexion due to pain. Tenderness to the anterior knee especially at the joint line.  Distal neurovascular motor sensory is intact.  Neurological: She is alert and oriented to person, place, and time. No cranial nerve deficit.  Skin: Skin is warm and dry.  Psychiatric: She has a normal mood and affect.    ED Course  ARTHOCENTESIS Date/Time: 01/08/2014 11:20 AM Performed by: Phineas Real Chee Dimon Authorized by: Bradd Canary D Consent: Verbal consent obtained. Risks and benefits: risks, benefits and alternatives were discussed Consent given by: patient Patient understanding: patient states understanding of the procedure being performed Patient identity confirmed: verbally with patient Indications: joint swelling and pain  Body area: knee Joint: right knee Local anesthesia used: yes Anesthesia: local infiltration Local anesthetic: lidocaine 2% without epinephrine Anesthetic total: 9 ml Patient sedated: no Preparation: Patient was prepped and draped in the usual sterile fashion. Needle gauge: 18 G Ultrasound guidance: no Approach: medial Aspirate: blood-tinged and yellow Aspirate amount: 20 ml Patient tolerance: Patient tolerated the procedure well with no immediate complications.   (including critical care time) Labs Review Labs Reviewed - No data to display Imaging Review No results found.   MDM   1. Knee effusion, right   2. DJD (degenerative joint disease) of knee   3. Recurrent pain of right knee    Aspirated 20 cc amber fluid, blood tinged. Cont meds as needed and keep appt with ortho as scheduled.    Hayden Rasmussen, NP 01/08/14 1122

## 2014-01-08 NOTE — Discharge Instructions (Signed)
Knee Effusion The medical term for having fluid in your knee is effusion. This is often due to an internal derangement of the knee. This means something is wrong inside the knee. Some of the causes of fluid in the knee may be torn cartilage, a torn ligament, or bleeding into the joint from an injury. Your knee is likely more difficult to bend and move. This is often because there is increased pain and pressure in the joint. The time it takes for recovery from a knee effusion depends on different factors, including:   Type of injury.  Your age.  Physical and medical conditions.  Rehabilitation Strategies. How long you will be away from your normal activities will depend on what kind of knee problem you have and how much damage is present. Your knee has two types of cartilage. Articular cartilage covers the bone ends and lets your knee bend and move smoothly. Two menisci, thick pads of cartilage that form a rim inside the joint, help absorb shock and stabilize your knee. Ligaments bind the bones together and support your knee joint. Muscles move the joint, help support your knee, and take stress off the joint itself. CAUSES  Often an effusion in the knee is caused by an injury to one of the menisci. This is often a tear in the cartilage. Recovery after a meniscus injury depends on how much meniscus is damaged and whether you have damaged other knee tissue. Small tears may heal on their own with conservative treatment. Conservative means rest, limited weight bearing activity and muscle strengthening exercises. Your recovery may take up to 6 weeks.  TREATMENT  Larger tears may require surgery. Meniscus injuries may be treated during arthroscopy. Arthroscopy is a procedure in which your surgeon uses a small telescope like instrument to look in your knee. Your caregiver can make a more accurate diagnosis (learning what is wrong) by performing an arthroscopic procedure. If your injury is on the inner margin  of the meniscus, your surgeon may trim the meniscus back to a smooth rim. In other cases your surgeon will try to repair a damaged meniscus with stitches (sutures). This may make rehabilitation take longer, but may provide better long term result by helping your knee keep its shock absorption capabilities. Ligaments which are completely torn usually require surgery for repair. HOME CARE INSTRUCTIONS  Use crutches as instructed.  If a brace is applied, use as directed.  Once you are home, an ice pack applied to your swollen knee may help with discomfort and help decrease swelling.  Keep your knee raised (elevated) when you are not up and around or on crutches.  Only take over-the-counter or prescription medicines for pain, discomfort, or fever as directed by your caregiver.  Your caregivers will help with instructions for rehabilitation of your knee. This often includes strengthening exercises.  You may resume a normal diet and activities as directed. SEEK MEDICAL CARE IF:   There is increased swelling in your knee.  You notice redness, swelling, or increasing pain in your knee.  An unexplained oral temperature above 102 F (38.9 C) develops. SEEK IMMEDIATE MEDICAL CARE IF:   You develop a rash.  You have difficulty breathing.  You have any allergic reactions from medications you may have been given.  There is severe pain with any motion of the knee. MAKE SURE YOU:   Understand these instructions.  Will watch your condition.  Will get help right away if you are not doing well or get worse.  Document Released: 12/15/2003 Document Revised: 12/17/2011 Document Reviewed: 02/18/2008 [facility] Patient Information 2014 [place], [place].  Knee Pain Knee pain can be a result of an injury or other medical conditions. Treatment will depend on the cause of your pain. HOME CARE  Only take medicine as told by your doctor.  Keep a healthy weight. Being overweight can make the knee hurt  more.  Stretch before exercising or playing sports.  If there is constant knee pain, change the way you exercise. Ask your doctor for advice.  Make sure shoes fit well. Choose the right shoe for the sport or activity.  Protect your knees. Wear kneepads if needed.  Rest when you are tired. GET HELP RIGHT AWAY IF:   Your knee pain does not stop.  Your knee pain does not get better.  Your knee joint feels hot to the touch.  You have a fever. MAKE SURE YOU:   Understand these instructions.  Will watch this condition.  Will get help right away if you are not doing well or get worse. Document Released: 12/21/2008 Document Revised: 12/17/2011 Document Reviewed: 12/21/2008 [facility] Patient Information 2014 [place], [place].  Osteoarthritis Osteoarthritis is a disease that causes soreness and swelling (inflammation) of a joint. It occurs when the cartilage at the affected joint wears down. Cartilage acts as a cushion, covering the ends of bones where they meet to form a joint. Osteoarthritis is the most common form of arthritis. It often occurs in older people. The joints affected most often by this condition include those in the:  Ends of the fingers.  Thumbs.  Neck.  Lower back.  Knees.  Hips. CAUSES  Over time, the cartilage that covers the ends of bones begins to wear away. This causes bone to rub on bone, producing pain and stiffness in the affected joints.  RISK FACTORS Certain factors can increase your chances of having osteoarthritis, including:  Older age.  Excessive body weight.  Overuse of joints. SIGNS AND SYMPTOMS   Pain, swelling, and stiffness in the joint.  Over time, the joint may lose its normal shape.  Small deposits of bone (osteophytes) may grow on the edges of the joint.  Bits of bone or cartilage can break off and float inside the joint space. This may cause more pain and damage. DIAGNOSIS  Your health care provider will do a physical exam  and ask about your symptoms. Various tests may be ordered, such as:  X-rays of the affected joint.  An MRI scan.  Blood tests to rule out other types of arthritis.  Joint fluid tests. This involves using a needle to draw fluid from the joint and examining the fluid under a microscope. TREATMENT  Goals of treatment are to control pain and improve joint function. Treatment plans may include:  A prescribed exercise program that allows for rest and joint relief.  A weight control plan.  Pain relief techniques, such as:  Properly applied heat and cold.  Electric pulses delivered to nerve endings under the skin (transcutaneous electrical nerve stimulation, TENS).  Massage.  Certain nutritional supplements.  Medicines to control pain, such as:  Acetaminophen.  Nonsteroidal anti-inflammatory drugs (NSAIDs), such as naproxen.  Narcotic or central-acting agents, such as tramadol.  Corticosteroids. These can be given orally or as an injection.  Surgery to reposition the bones and relieve pain (osteotomy) or to remove loose pieces of bone and cartilage. Joint replacement may be needed in advanced states of osteoarthritis. HOME CARE INSTRUCTIONS   Only take over-the-counter  or prescription medicines as directed by your health care provider. Take all medicines exactly as instructed.  Maintain a healthy weight. Follow your health care provider's instructions for weight control. This may include dietary instructions.  Exercise as directed. Your health care provider can recommend specific types of exercise. These may include:  Strengthening exercises These are done to strengthen the muscles that support joints affected by arthritis. They can be performed with weights or with exercise bands to add resistance.  Aerobic activities These are exercises, such as brisk walking or low-impact aerobics, that get your heart pumping.  Range-of-motion activities These keep your joints  limber.  Balance and agility exercises These help you maintain daily living skills.  Rest your affected joints as directed by your health care provider.  Follow up with your health care provider as directed. SEEK MEDICAL CARE IF:   Your skin turns red.  You develop a rash in addition to your joint pain.  You have worsening joint pain. SEEK IMMEDIATE MEDICAL CARE IF:  You have a significant loss of weight or appetite.  You have a fever along with joint or muscle aches.  You have night sweats. FOR MORE INFORMATION  National Institute of Arthritis and Musculoskeletal and Skin Diseases: www.niams.http://www.myers.net/ [employer] on Aging: https://walker.com/ American College of Rheumatology: www.rheumatology.org Document Released: 09/24/2005 Document Revised: 07/15/2013 Document Reviewed: 06/01/2013 [facility] Patient Information 2014 [place], [place].

## 2014-01-08 NOTE — ED Provider Notes (Signed)
Medical screening examination/treatment/procedure(s) were performed by resident physician or non-physician practitioner and as supervising physician I was immediately available for consultation/collaboration.   Barkley Bruns MD.   Linna Hoff, MD 01/08/14 [phone]

## 2014-01-08 NOTE — ED Notes (Signed)
C/o pain and swelling in knee; supposed to have a knee replacement

## 2014-04-19 ENCOUNTER — Emergency Department (INDEPENDENT_AMBULATORY_CARE_PROVIDER_SITE_OTHER)
Admission: EM | Admit: 2014-04-19 | Discharge: 2014-04-19 | Disposition: A | Discharge status: Home / Self Care | Payer: Medicare Other | Source: Home / Self Care | Attending: Family Medicine | Admitting: Family Medicine

## 2014-04-19 ENCOUNTER — Encounter (HOSPITAL_COMMUNITY): Payer: Self-pay | Admitting: Emergency Medicine

## 2014-04-19 DIAGNOSIS — R197 Diarrhea, unspecified: Secondary | ICD-10-CM

## 2014-04-19 MED ORDER — DIPHENOXYLATE-ATROPINE 2.5-0.025 MG PO TABS: 2.0000 | ORAL_TABLET | Freq: Four times a day (QID) | ORAL | Status: AC

## 2014-04-19 MED ORDER — ALBUTEROL SULFATE HFA 108 (90 BASE) MCG/ACT IN AERS: 1.0000 | INHALATION_SPRAY | Freq: Four times a day (QID) | RESPIRATORY_TRACT | Status: AC | PRN

## 2014-04-19 NOTE — Discharge Instructions (Signed)
Clear liquid , bland diet  as tolerated, advance on wed as improved, use medicine as needed, see your doctor if any problems.

## 2014-04-19 NOTE — ED Provider Notes (Signed)
CSN: [id number]     Arrival date & time 04/19/14  1715 History   First MD Initiated Contact with Patient 04/19/14 1843     Chief Complaint  Patient presents with  . Diarrhea   (Consider location/radiation/quality/duration/timing/severity/associated sxs/prior Treatment) Patient is a 68 y.o. female presenting with diarrhea. The history is provided by the patient.  Diarrhea Quality:  Watery Severity:  Mild Onset quality:  Gradual Duration:  5 days Progression:  Worsening Relieved by:  None tried Worsened by:  Nothing tried Ineffective treatments:  None tried Associated symptoms: no abdominal pain, no fever and no vomiting     Past Medical History  Diagnosis Date  . Osteoarthritis   . Rheumatoid arthritis(714.0)   . Genital herpes   . COPD (chronic obstructive pulmonary disease)   . Asthma    Past Surgical History  Procedure Laterality Date  . Tubal ligation    . Tonsillectomy     History reviewed. No pertinent family history. History  Substance Use Topics  . Smoking status: Current Every Day Smoker -- 0.50 packs/day    Types: Cigarettes  . Smokeless tobacco: Not on file  . Alcohol Use: Yes   OB History   Grav Para Term Preterm Abortions TAB SAB Ect Mult Living                 Review of Systems  Constitutional: Negative.  Negative for fever.  Gastrointestinal: Positive for diarrhea. Negative for nausea, vomiting, abdominal pain and blood in stool.  Skin: Negative.     Allergies  Penicillins  Home Medications   Prior to Admission medications   Medication Sig Start Date End Date Taking? Authorizing Provider  alendronate (FOSAMAX) 70 MG tablet Take 70 mg by mouth every 7 (seven) days. Take with a full glass of water on an empty stomach.  Usually takes on Saturday.   Yes Historical Provider, MD  leucovorin (WELLCOVORIN) 10 MG tablet Take 20 mg by mouth once a week. Takes 2 tabs on the day after methotrexate (tuesday)   Yes Historical Provider, MD  methotrexate  (RHEUMATREX) 2.5 MG tablet Take 20 mg by mouth once a week. Caution:Chemotherapy. Protect from light. Takes 8 tablets every week on Tuesdays.   Yes Historical Provider, MD  albuterol (PROVENTIL HFA;VENTOLIN HFA) 108 (90 BASE) MCG/ACT inhaler Inhale 1-2 puffs into the lungs every 6 (six) hours as needed for wheezing or shortness of breath. 04/19/14   Linna Hoff, MD  cyclobenzaprine (FLEXERIL) 10 MG tablet Take 10 mg by mouth 3 (three) times daily as needed for muscle spasms.    Historical Provider, MD  diphenoxylate-atropine (LOMOTIL) 2.5-0.025 MG per tablet Take 2 tablets by mouth 4 (four) times daily. Prn diarrhea. 04/19/14   Linna Hoff, MD  Fluticasone-Salmeterol (ADVAIR) 500-50 MCG/DOSE AEPB Inhale 1 puff into the lungs daily as needed (for shortness of breath).     Historical Provider, MD  ibuprofen (ADVIL,MOTRIN) 800 MG tablet Take 800 mg by mouth every 8 (eight) hours as needed for pain.    Historical Provider, MD  oxyCODONE-acetaminophen (PERCOCET/ROXICET) 5-325 MG per tablet Take 1 tablet by mouth every 8 (eight) hours as needed for severe pain. 12/28/13   Jimmye Norman, NP  traMADol (ULTRAM) 50 MG tablet Take 50 mg by mouth every 12 (twelve) hours as needed for moderate pain.    Historical Provider, MD  zolpidem (AMBIEN) 10 MG tablet Take 10 mg by mouth at bedtime as needed for sleep.    Historical Provider, MD  BP 125/83  Pulse 104  Temp(Src) 97.9 F (36.6 C) (Oral)  Resp 18  SpO2 98% Physical Exam  Nursing note and vitals reviewed. Constitutional: She is oriented to person, place, and time. She appears well-developed and well-nourished. No distress.  Neck: Normal range of motion. Neck supple.  Abdominal: Soft. Bowel sounds are normal. She exhibits no distension and no mass. There is no tenderness. There is no rebound and no guarding.  Lymphadenopathy:    She has no cervical adenopathy.  Neurological: She is alert and oriented to person, place, and time.  Skin: Skin is warm  and dry.    ED Course  Procedures (including critical care time) Labs Review Labs Reviewed - No data to display  Imaging Review No results found.   MDM   1. Diarrhea in adult patient        Linna Hoff, MD 04/19/14 [phone]

## 2014-04-19 NOTE — ED Notes (Signed)
States she left her medication behind on a trip , and now is having problems related to her arthritis. Asking for replacement Rx

## 2015-07-15 IMAGING — CR DG SHOULDER 2+V*R*
3 series · 3 of 3 positions shown · non-contrast
Comparison: Prior chest x-ray 10/17/2013

CLINICAL DATA: Fall, right shoulder and knee pain

EXAM:
RIGHT SHOULDER - 2+ VIEW

[w shoulder ap internal righ]
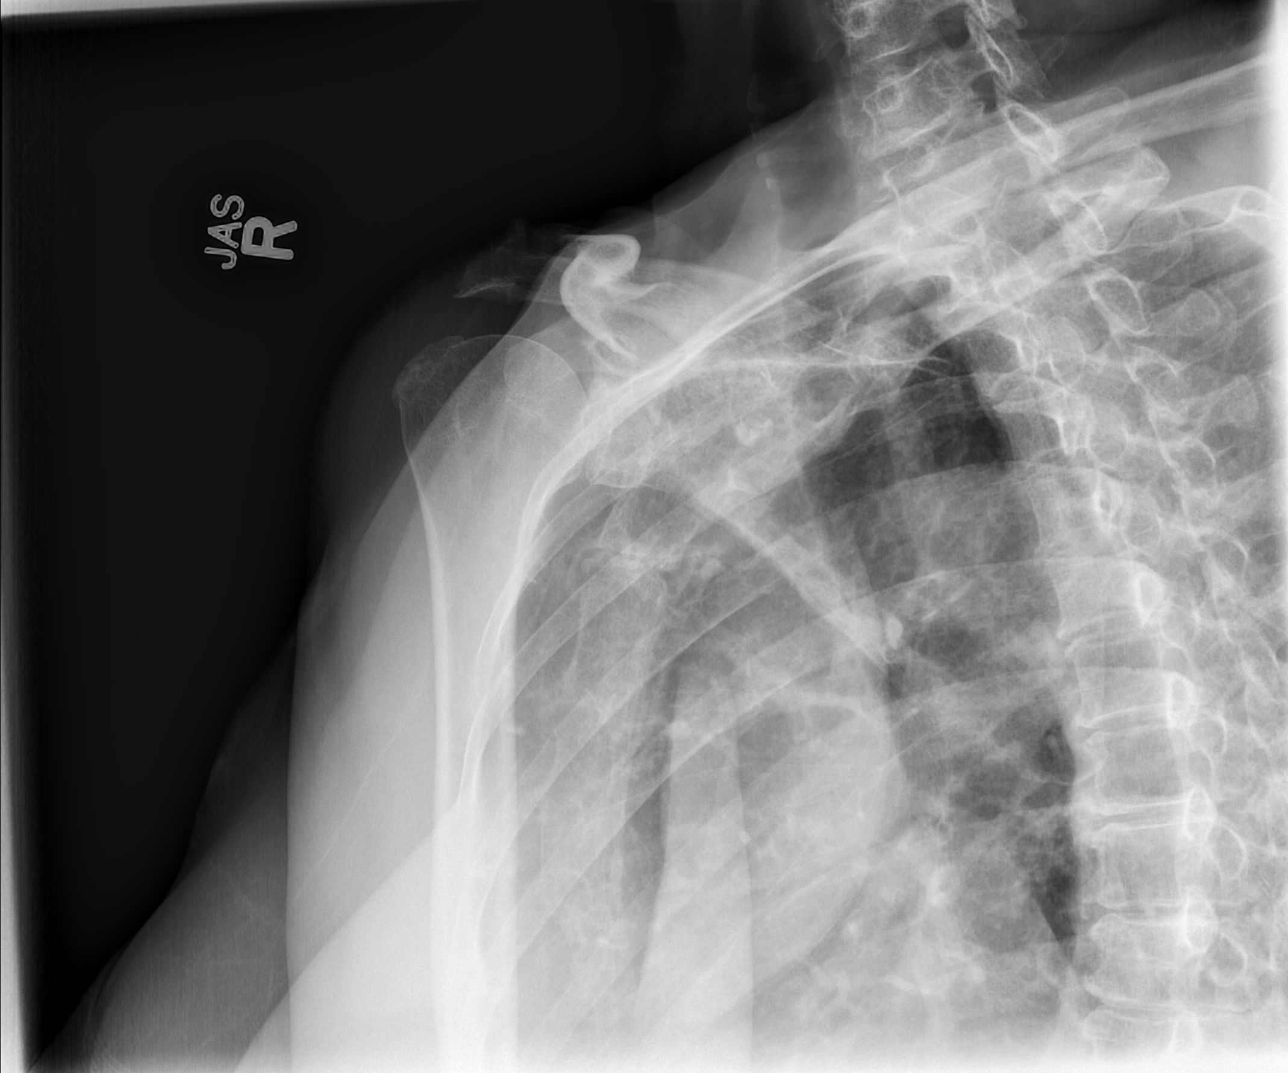

[w shoulder y view right]
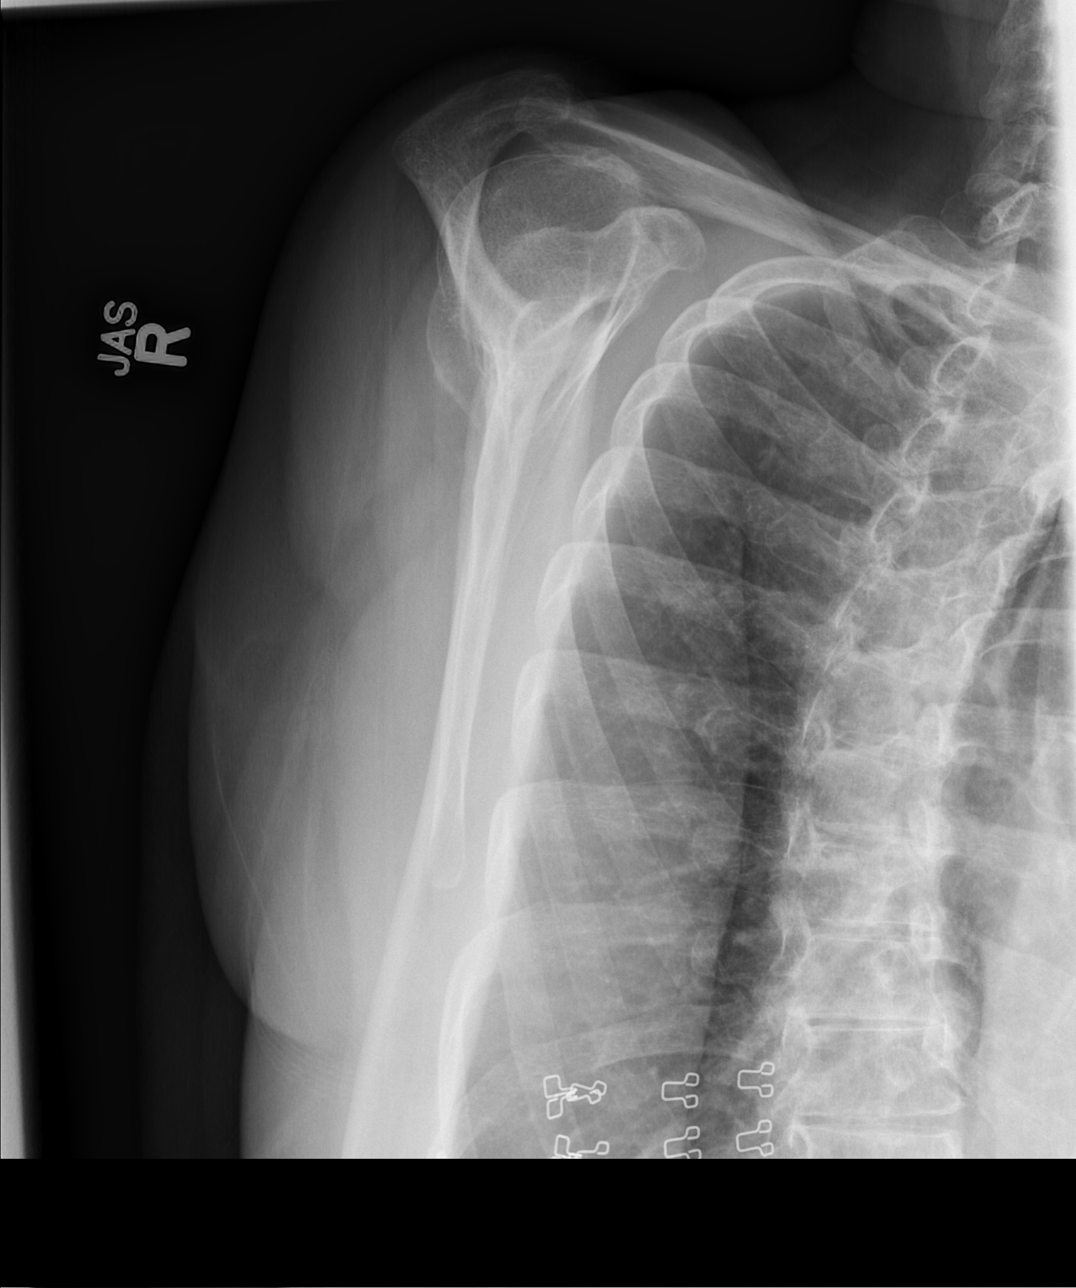

[w shoulder axillary right *]
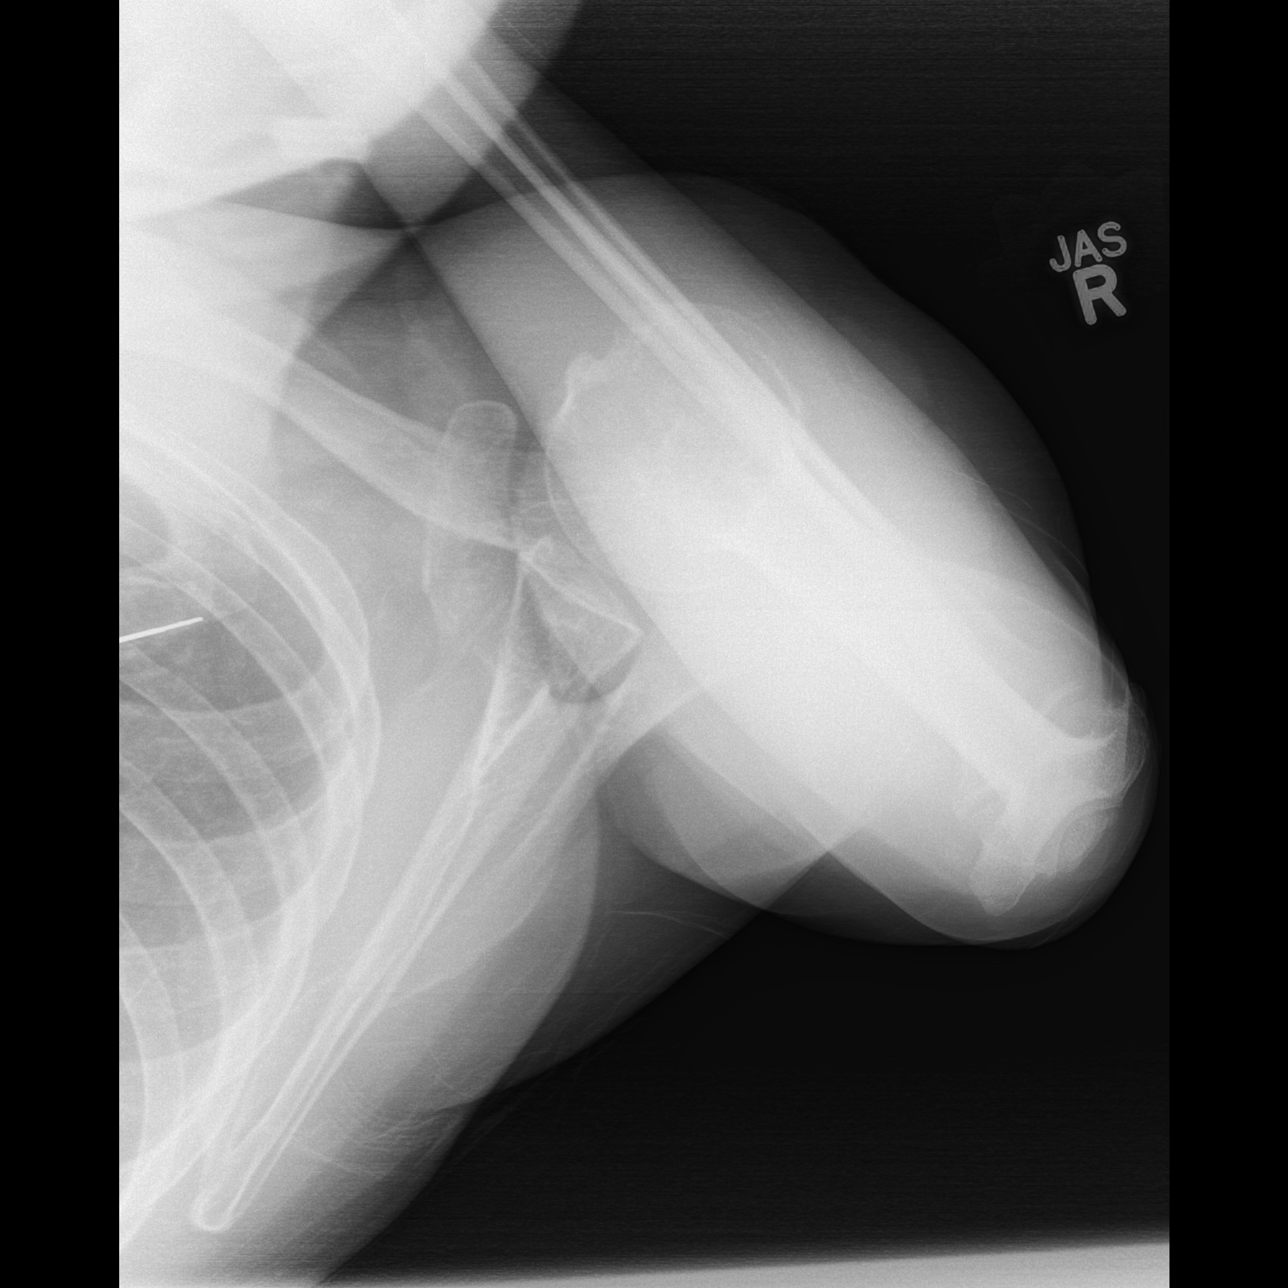

[3 of 3 positions shown; findings below may reference images not displayed]

FINDINGS: There is no evidence of fracture or dislocation. There is no
evidence of arthropathy or other focal bone abnormality. Soft
tissues are unremarkable.
IMPRESSION: Negative.

## 2015-07-15 IMAGING — CR DG KNEE COMPLETE 4+V*R*
4 series · 4 of 4 positions shown · non-contrast
Comparison: None.

CLINICAL DATA: Fall, right shoulder in the pain

EXAM:
RIGHT KNEE - COMPLETE 4+ VIEW

[t knee ap right]
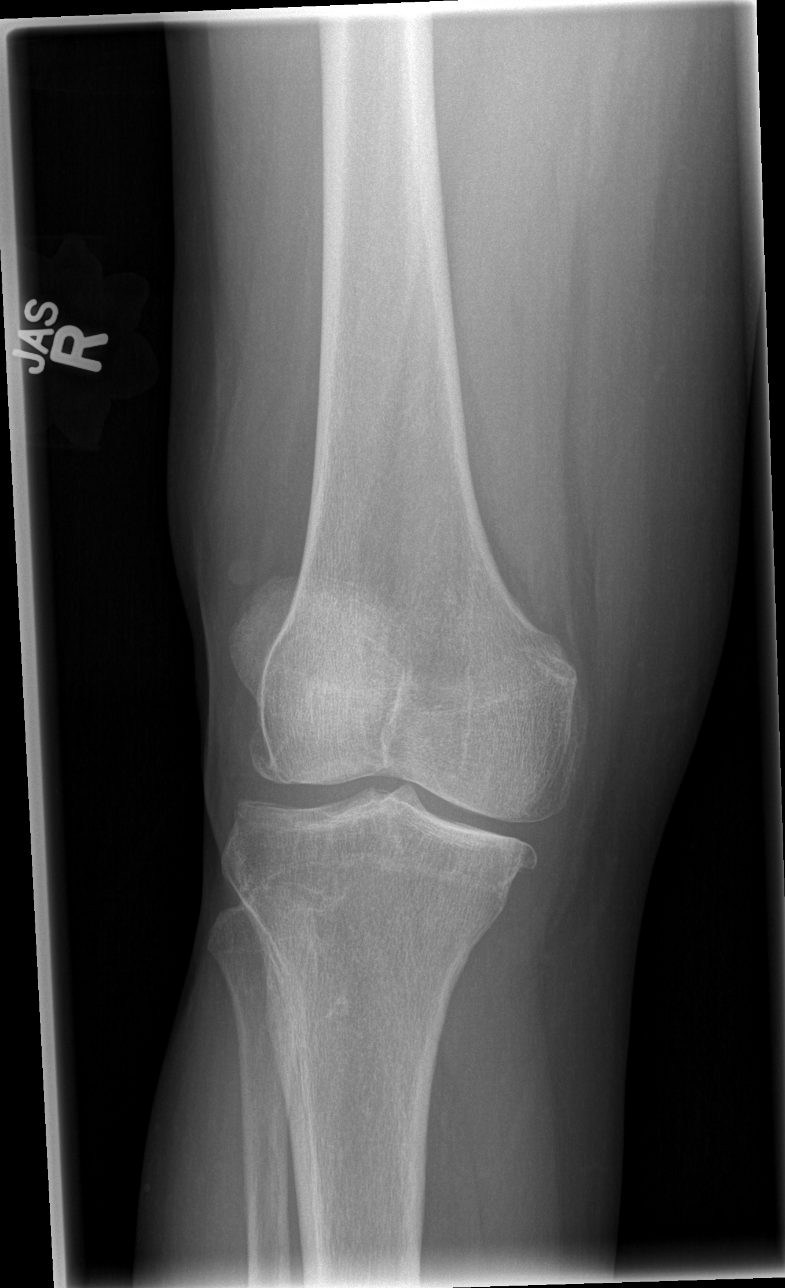

[t knee oblique right (1 of 2)]
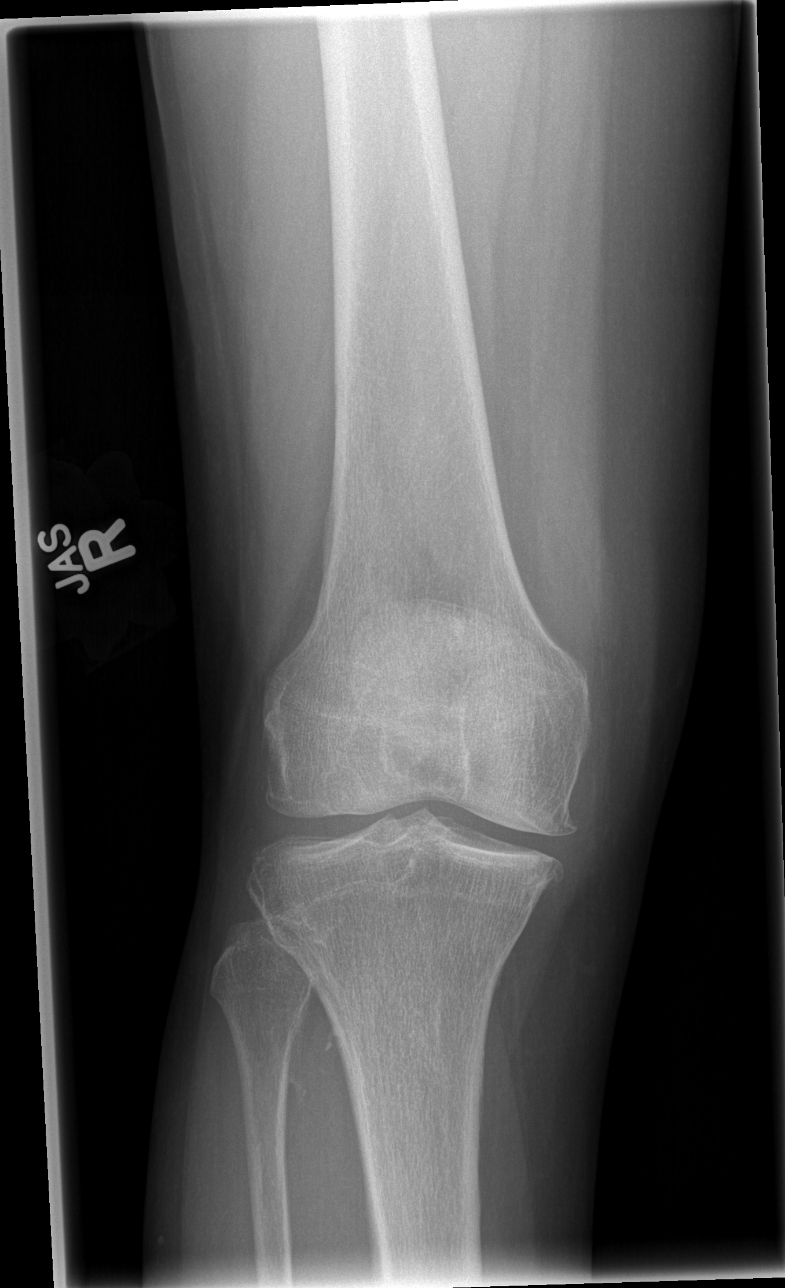

[t knee oblique right (2 of 2)]
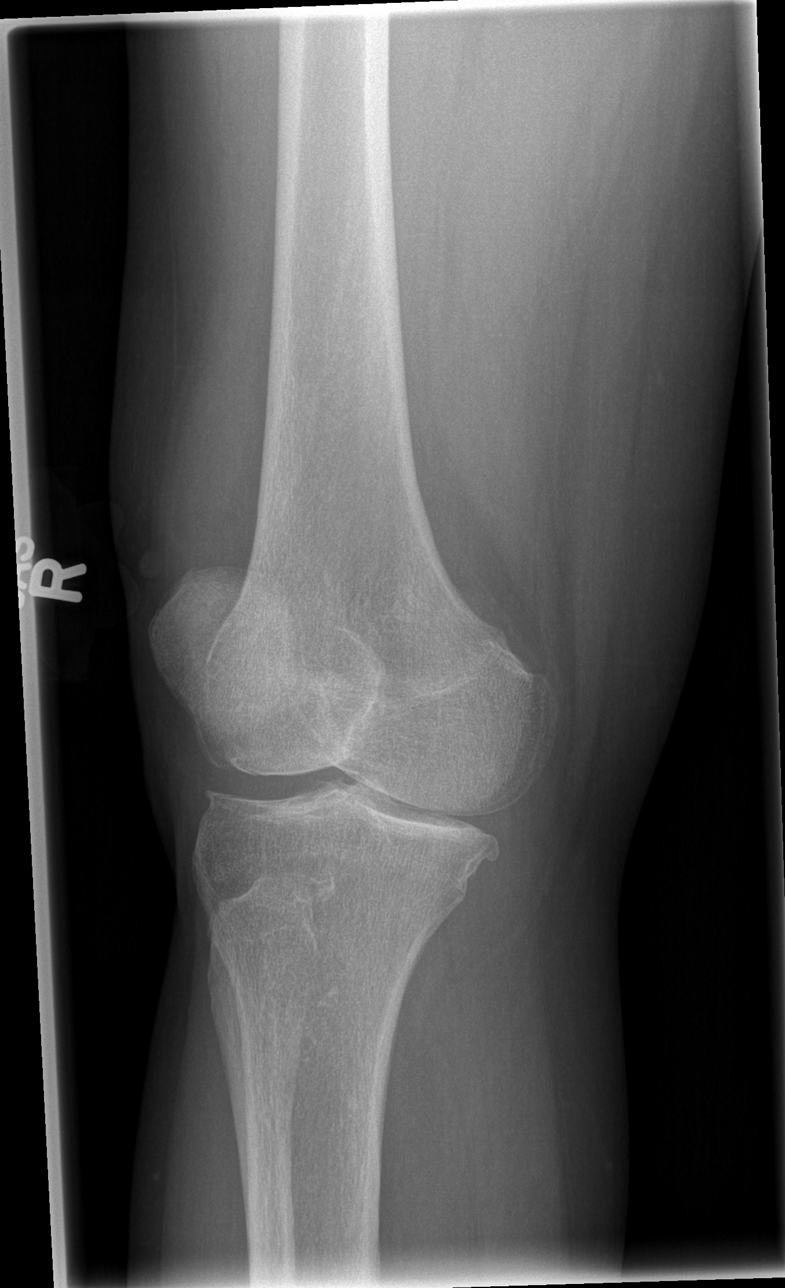

[t knee lat right]
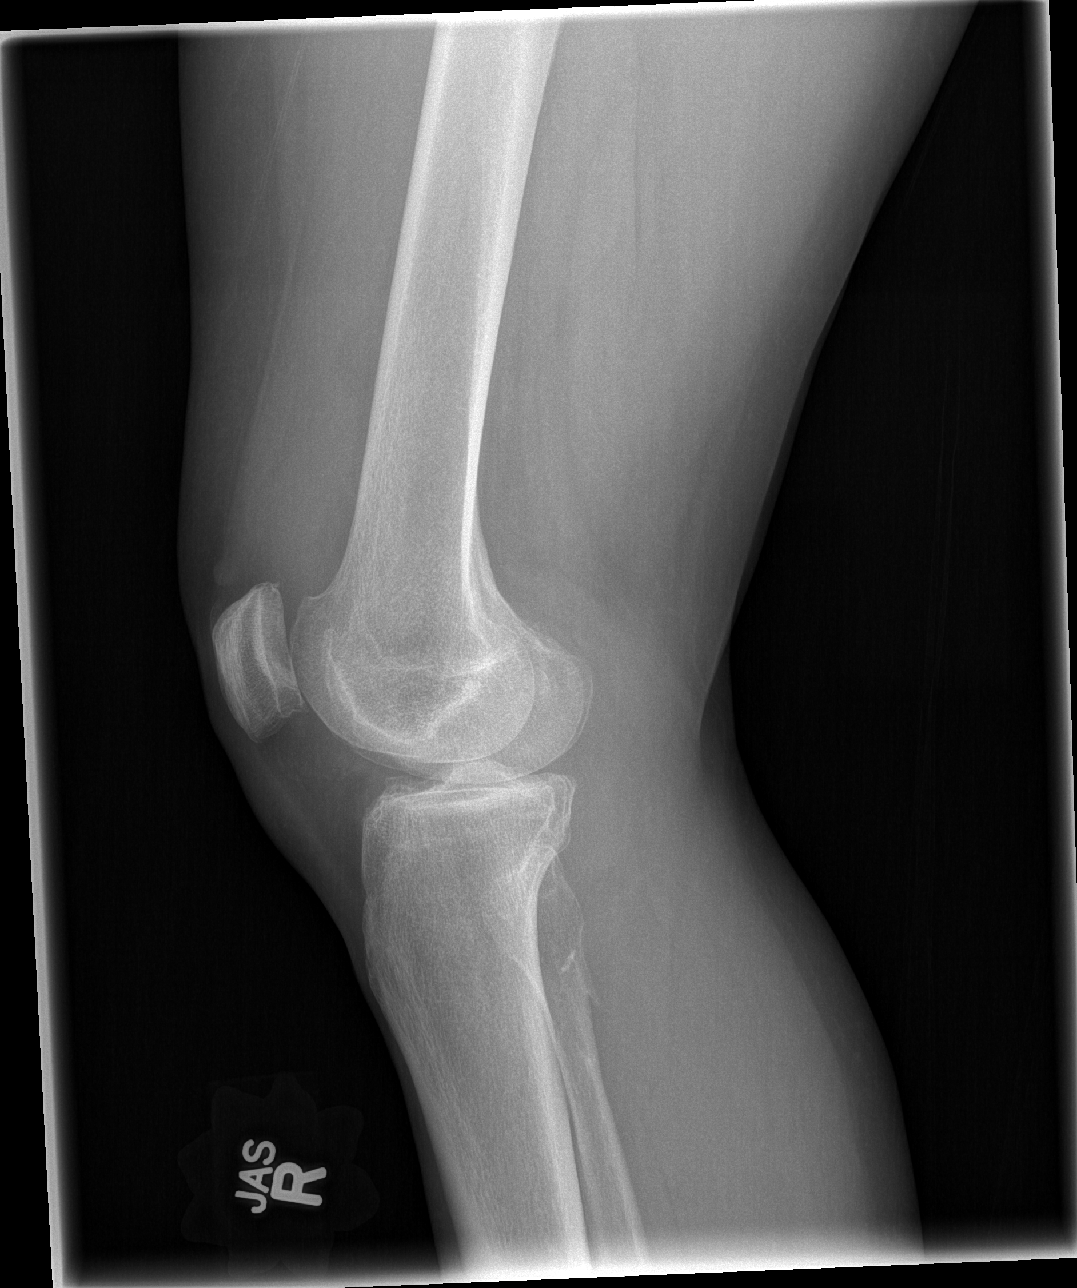

[4 of 4 positions shown; findings below may reference images not displayed]

FINDINGS: No acute fracture or malalignment. Moderately large suprapatellar
knee joint effusion. Mild tricompartmental degenerative
osteoarthritis most significant in the medial compartment where
there is asymmetric joint space narrowing and productive osteophyte
formation. Mild scattered atherosclerotic vascular calcifications
noted incidentally. No lytic or blastic osseous lesion.
IMPRESSION: 1. Moderately large suprapatellar knee joint effusion. Differential
considerations include acute internal derangement with hemarthrosis,
degenerative, and less likely infectious.
2. No acute fracture or malalignment.
3. Tricompartmental degenerative osteoarthritis most significant in
the medial compartment.
